# Patient Record
Sex: Female | Born: 1965 | Race: White | Hispanic: No | Smoking: Former smoker
Health system: Southern US, Community
[De-identification: ages and names within clinical notes are randomized; demographics above are authoritative.]

## PROBLEM LIST (undated history)

## (undated) DIAGNOSIS — F32A Depression, unspecified: Secondary | ICD-10-CM

## (undated) DIAGNOSIS — F329 Major depressive disorder, single episode, unspecified: Secondary | ICD-10-CM

## (undated) DIAGNOSIS — T7840XA Allergy, unspecified, initial encounter: Secondary | ICD-10-CM

## (undated) HISTORY — DX: Allergy, unspecified, initial encounter: T78.40XA

## (undated) HISTORY — DX: Depression, unspecified: F32.A

## (undated) HISTORY — PX: ENDOMETRIAL ABLATION: SHX621

---
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
## 1998-02-18 ENCOUNTER — Other Ambulatory Visit: Admission: RE | Admit: 1998-02-18 | Discharge: 1998-02-18 | Payer: Self-pay | Admitting: *Deleted

## 1999-01-25 ENCOUNTER — Inpatient Hospital Stay (HOSPITAL_COMMUNITY): Admission: AD | Admit: 1999-01-25 | Discharge: 1999-01-25 | Payer: Self-pay | Admitting: Obstetrics & Gynecology

## 2002-10-18 HISTORY — PX: BACK SURGERY: SHX140

## 2003-06-21 ENCOUNTER — Other Ambulatory Visit: Admission: RE | Admit: 2003-06-21 | Discharge: 2003-06-21 | Payer: Self-pay | Admitting: Obstetrics and Gynecology

## 2004-01-22 ENCOUNTER — Encounter (INDEPENDENT_AMBULATORY_CARE_PROVIDER_SITE_OTHER): Payer: Self-pay | Admitting: Specialist

## 2004-01-22 ENCOUNTER — Observation Stay (HOSPITAL_COMMUNITY): Admission: RE | Admit: 2004-01-22 | Discharge: 2004-01-23 | Payer: Self-pay | Admitting: Specialist

## 2004-10-07 IMAGING — CR DG SPINE 1V PORT
1 series · 1 of 1 positions shown · non-contrast
Comparison: none

CLINICAL DATA: 37 year-old lumbar laminectomy
 LATERAL LUMBAR SPINE FILM
 Lateral lumbar spine film from the Operating Room labeled #1 demonstrates a spinal needle projecting at L5-S1 disc space level.
 IMPRESSION
 L5-S1 marked intraoperatively.
 LUMBAR SPINE FILM # 2
 Surgical instruments and retractors at the L5-S1 level.

[view not recorded]
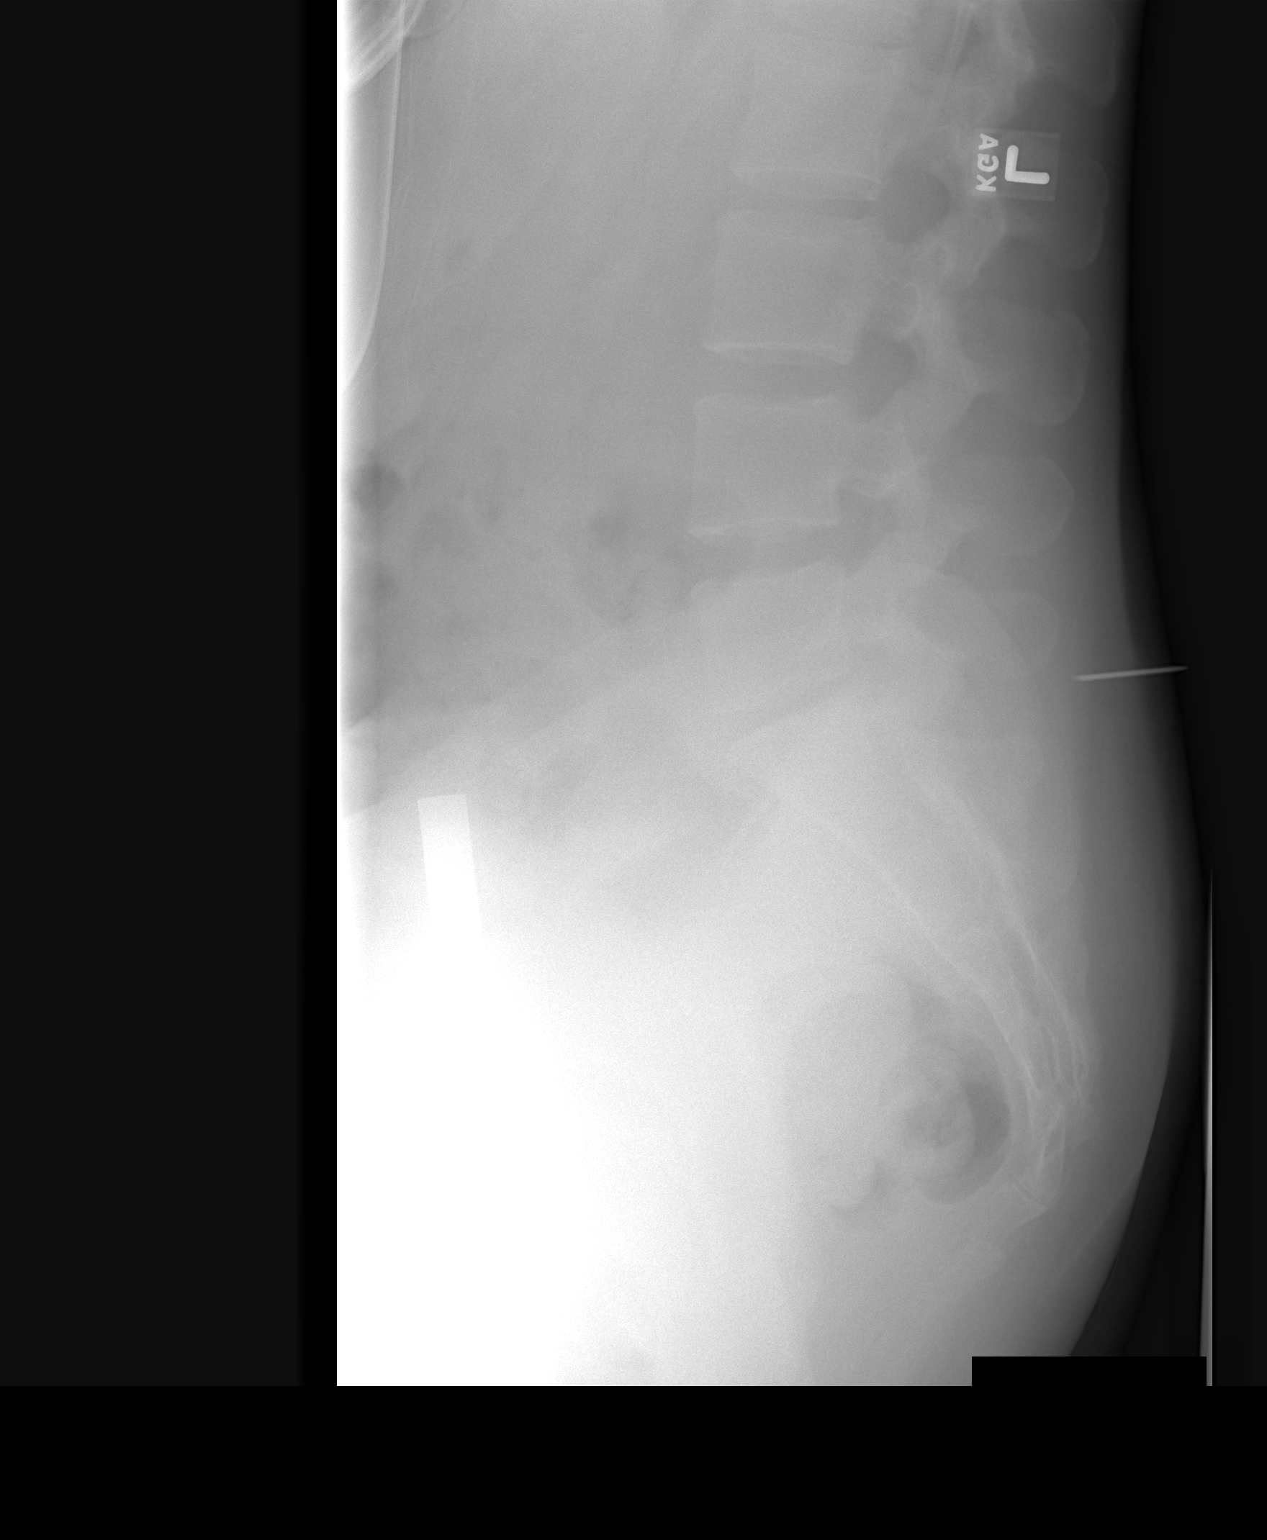

[1 of 1 positions shown; findings below may reference images not displayed]

## 2007-12-08 ENCOUNTER — Encounter (INDEPENDENT_AMBULATORY_CARE_PROVIDER_SITE_OTHER): Payer: Self-pay | Admitting: Obstetrics and Gynecology

## 2007-12-08 ENCOUNTER — Ambulatory Visit (HOSPITAL_COMMUNITY): Admission: RE | Admit: 2007-12-08 | Discharge: 2007-12-08 | Payer: Self-pay | Admitting: Obstetrics and Gynecology

## 2008-02-15 ENCOUNTER — Ambulatory Visit (HOSPITAL_COMMUNITY): Admission: RE | Admit: 2008-02-15 | Discharge: 2008-02-15 | Payer: Self-pay | Admitting: Obstetrics and Gynecology

## 2011-03-02 NOTE — H&P (Signed)
Alejandra Ryan, Alejandra Ryan               ACCOUNT NO.:  [id number]   MEDICAL RECORD NO.:  [id number]          PATIENT TYPE:  AMB   LOCATION:  SDC                           FACILITY:  [facility]   PHYSICIAN:  Lenoard Aden, M.D.DATE OF BIRTH:  20-Apr-1966   DATE OF ADMISSION:  12/08/2007  DATE OF DISCHARGE:                              HISTORY & PHYSICAL   CHIEF COMPLAINT:  Dysmenorrhea, menorrhagia, and dyspareunia for  surgical intervention.   HISTORY OF PRESENT ILLNESS:  She is a 45 year old white female, gravida  1, para 1, with a history of persistent dyspareunia and known labial  hypertrophy for NovaSure hysteroscopy, D&C, and perineorrhaphy, possible  labioplasty.   ALLERGIES:  PENICILLIN, SULFA.   FAMILY HISTORY:  Heart disease.   PAST MEDICAL HISTORY:  She has a history of a laser cone biopsy for CIN-  2 in 1989, history of cesarean section, history of intermittent tobacco  use.   PHYSICAL EXAMINATION:  GENERAL:  She is a well-developed, well-  nourished, white female in no acute distress.  HEENT:  Normal.  LUNGS:  Clear.  HEART:  Regular rhythm.  ABDOMEN:  Soft and nontender.  PELVIC:  The uterus is normal size, shape, and contour.  No adnexal  masses.  The uterus is anteverted.  EXTREMITIES:  No cords.  NEUROLOGY:  Nonfocal.  SKIN:  Intact.   IMPRESSION:  1. Dysmenorrhea and menometrorrhagia with endometrial polyp.  2. Labial hypertrophy, secondary dyspareunia for perineoplasty and      labial repair.   PLAN:  Proceed with diagnostic hysteroscopy, resectoscopic polypectomy,  D&C, NovaSure ablation, and perineorrhaphy labioplasty.  Risks of  anesthesia, infection, bleeding, injury to abdominal organs with need  for repair is discussed with possible uterine perforation and need for  repair is discussed, possible labial scarring with secondary dyspareunia  noted.  The patient acknowledges and wishes to proceed.     Lenoard Aden, M.D.  Electronically Signed    RJT/MEDQ  D:  12/07/2007  T:  12/07/2007  Job:  [id number]

## 2011-03-02 NOTE — Op Note (Signed)
NAMEJASELLE, Alejandra Ryan               ACCOUNT NO.:  [id number]   MEDICAL RECORD NO.:  [id number]          PATIENT TYPE:  AMB   LOCATION:  SDC                           FACILITY:  [facility]   PHYSICIAN:  Lenoard Aden, M.D.DATE OF BIRTH:  Dec 17, 1965   DATE OF PROCEDURE:  02/15/2008  DATE OF DISCHARGE:                               OPERATIVE REPORT   PREOPERATIVE DIAGNOSIS:  Dehiscence of previous perineorrhaphy.   POSTOPERATIVE DIAGNOSIS:  Dehiscence of previous perineorrhaphy.   PROCEDURE:  Repair of perineorrhaphy dehiscence.   SURGEON:  Lenoard Aden, MD   ANESTHESIA:  General.   ESTIMATED BLOOD LOSS:  50 mL.   COMPLICATIONS:  None.   DRAINS:  None.   COUNTS:  Correct.   DISPOSITION:  To recovery in good condition.   PROCEDURE IN DETAIL:  After apprised of risks of anesthesia, infection,  bleeding, injury to abdominal organs, need for repair, delayed versus  immediate complications to include bowel and bladder injury, and the  inability to assure perineal discomfort, the patient was brought to the  operating where she was administered a general anesthetic without  complications.  She was prepped and draped in usual sterile fashion and  catheterized until the bladder was empty.  After achieving adequate  anesthesia, the 2 dehiscent flaps were noted.  These areas were outlined  in relation to the area of previous attachment.  The surgical edge from  previous attachment was reconstructed using Metzenbaum scissors creating  a free surgical edge on both ends of the dehisced perineal repair.  These were then sutured on the inside and outside margins side-to-side  using a 3-0 Vicryl suture in an interrupted fashion.  Good  hemostasis was noted.  At this time, there was also a nodular area along  the left peroneal/posterior fourchette area which was excised sharply  and sutured using a 3-0 Vicryl suture.  Good hemostasis was noted.  Ice  pads were applied.  The patient  tolerated the procedure well and was  transferred to recovery in good condition.      Lenoard Aden, M.D.  Electronically Signed     RJT/MEDQ  D:  02/15/2008  T:  02/16/2008  Job:  [id number]   cc:   Rocco Pauls

## 2011-03-02 NOTE — Op Note (Signed)
[name], Alejandra Ryan               ACCOUNT NO.:  [id number]   MEDICAL RECORD NO.:  [id number]          PATIENT TYPE:  AMB   LOCATION:  SDC                           [facility]ACILITY:  [facility]   PHYSICIAN:  Lenoard Aden, M.D.DATE OF BIRTH:  08-26-1966   DATE OF PROCEDURE:  12/08/2007  DATE OF DISCHARGE:                               OPERATIVE REPORT   PREOPERATIVE DIAGNOSIS:  1. Vulvodynia.  2. Vulvar scarring.  3. Dyspareunia.  4. Labial hypertrophy.  5. Menorrhagia.  6. Endometrial polyp.   POSTOPERATIVE DIAGNOSIS:  1. Vulvodynia.  2. Vulvar scarring.  3. Dyspareunia.  4. Labial hypertrophy.  5. Menorrhagia.  6. Endometrial polyp.   PROCEDURE:  Diagnostic hysteroscopy, dilatation and curettage,  resectoscopic polypectomy, NovaSure endometrial ablation, labioplasty  perineorrhaphy.   SURGEON:  Lenoard Aden, M.D.   ANESTHESIA:  General.   ESTIMATED BLOOD LOSS:  50 mL.   COMPLICATIONS:  None.   DRAINS:  None.   COUNTS:  Correct.   DISPOSITION:  Patient to recovery in good condition.   SPECIMEN:  Endometrial curettings and endometrial polyp to pathology.   BRIEF OPERATIVE NOTE:  After being apprised of the risks of anesthesia,  infection, bleeding, injury to abdominal organs with need repair,  delayed versus immediate complications to include bowel and bladder  injury, the patient was brought to the operating room where she was  administered a general anesthetic without complications, prepped and  draped in the usual sterile fashion, catheterized until the bladder was  empty.  After achieving adequate anesthesia, a speculum was placed.  The  anterior lip of the cervix was grasped using a single tooth tenaculum  and a dilute paracervical block was placed using 20 mL Nesacaine  solution.  A dilute solution of Pitressin was placed at 3 and 9 o'clock  for an intracervical block after aspiration to make sure no blood was in  this syringe, a total of 16 mL.  The cervix  was then easily dilated up  to #29 [facility] dilator.  The hysteroscope was placed.  Visualization  revealed a broad based endometrial polyp along the anterior wall which  was resected using a single loop with two passes. At this time, no  lesions were seen. D&C is performed without difficulty using sharp  curettage in a four quadrant method.  At this time, measurements having  been taken for the NovaSure, the NovaSure device was placed and CO2 test  was performed and is negative.  NovaSure is then performed without  difficulty after activating the NovaSure, the device measures a cavity  width of 2.9, power of 62 time, time of 1 minute 25 seconds.  The  NovaSure is removed.  The hysteroscope is replaced. Visualization  reveals no evidence of uterine perforation and a well ablated  endometrial cavity.   At this time, attention is turned to the exterior portion where there is  marked labial hypertrophy and scarring noted.  A V-shaped incision was  made along the labia minora on the right and left side excising a large  wedge shaped piece of tissue.  This was closed inside  to out creating an  anterior suture line using multiple interrupted 3-0 Vicryl sutures. At  this time, a perineorrhaphy was placed in standard fashion and closed  using a 3-0 Vicryl suture, as well.  Attention was turned to some  scarring along both labia minora anteriorly.  A  small flap of tissue  was then removed from both sides and the defect was closed using  interrupted 3-0 Vicryl sutures.  Good hemostasis was noted.  All  instruments were removed.  The patient tolerated the procedure well and  was awakened and transferred to recovery in good condition.      Lenoard Aden, M.D.  Electronically Signed     RJT/MEDQ  D:  12/08/2007  T:  12/09/2007  Job:  [id number]

## 2011-03-05 NOTE — Op Note (Signed)
NAME:  Alejandra Ryan, Alejandra Ryan                           ACCOUNT NO.:  [id number]   MEDICAL RECORD NO.:  [id number]                   PATIENT TYPE:  AMB   LOCATION:  DAY                                  FACILITY:  [facility]   PHYSICIAN:  Jene Every, M.D.                 DATE OF BIRTH:  April 04, 1966   DATE OF PROCEDURE:  01/22/2004  DATE OF DISCHARGE:                                 OPERATIVE REPORT   PREOPERATIVE DIAGNOSES:  1. Herniated nucleus pulposus, L5-S1.  2. Degenerative disk disease, L5-S1.  3. Free fragment.  4. Spinal stenosis.   POSTOPERATIVE DIAGNOSES:  1. Herniated nucleus pulposus, L5-S1.  2. Degenerative disk disease, L5-S1.  3. Free fragment.  4. Spinal stenosis.   OPERATION/PROCEDURE:  1. Lateral recess decompression, L5-S1.  2. Treatment of free fragment.  3. Decompression of L5 nerve root.  4. Microdiskectomy, L5-S1.   ANESTHESIA:  General.   SURGEON:  Jene Every, M.D.   ASSISTANT:  Roma Schanz, P.A.-C.   BRIEF HISTORY AND INDICATIONS:  This is a 45 year old female with acute  onset of L5 radiculopathy demonstrated by MRI to compress the L5 root.  She  had a history of disk degeneration, posterior osteophytic spurring the  fragment was high on the L5 vertebral body midway between the L4-5 and L5-S1  disk space, probably a migrated cephalad from L5-S1 disk space compressing  the 5 root.  Due to the compression and the positive neurotension signs, a  preoperative exam demonstrate decreased sensation in the L5-S1 dermatome and  the HL weakness.  Operative intervention indicated for decompression of the  L5 root.  We discussed the possibility of requiring the decompression at the  L4-5 and at L5-S1 due to the cephalomigration and the probability of  requiring a hemilaminectomy.  Also, risks and benefits were discussed  including bleeding, infection, injury to vascular structures, recurrent disk  herniation, need for fusion, adjacent segment disease, epidural  fibrosis,  persistent numbness and weakness, etc.   DESCRIPTION OF PROCEDURE:  With the patient in the supine position and after  induction of adequate general anesthesia and 1 g of Kefzol, she  was placed  prone on the [place] frame.  All bony prominences were well padded.  The  lumbar region was prepped and draped in the usual sterile fashion.  An 13-  gauge spinal needle was utilized to localize the L5-S1 interspace confirmed  with x-ray.  Incision was made from above the spinous process of L5-S1.  Subcutaneous tissue was dissected using electrocautery to achieve  hemostasis.  Dorsal lumbar fascia was identified and divided in  line with  the skin incision.  Paraspinous muscle elevated from the lamina of L5-S1 so  that we could visualize the L4-5 interlaminar space.  Second confirmatory  radiograph was obtained showing the [facility] retractor and then the  pedicle and probable placement of the disk rupture was cephalad to form  the  hemilaminotomy of the caudad edge of the L5 with the two 3-mm Kerrisons,  preserving the pars extending cephalad; this to the mid portion of an upper  border of the pedicle as evidenced by the MRI.  This was attached to the  cephalad edge of the ligamentum flavum was carefully, meticulously done,  preserving the L5 root.  Ligamentum flavum had been detached and cephalad  edge of S1 and I decompressed the lateral recess by performing partial  medial hemifacetectomy, lateral to the thecal sac and nerve root.  Ligamentum flavum removed from the interspace.  There was epidural venous  plexus.  The disk rupture at L5-S1 was noted but cephalad and the axilla of  the root severely compressing the root to the lateral margin as well as a  disk fragment.  This was mobilized without traction utilizing a  nerve hook  mobilized, three separate large fragments from beneath the thecal sac and  into the undersurface of the L5 root.  We explored above the L5 root and  above  the L5 pedicles so that we were clearly above and below where the  fragments were noted.  Following this, there was excellent mobilization of  the L5 root.  No residual disk herniation.  Bipolar electrocautery to  achieve hemostasis.   Next, we gently mobilized the thecal sac medially and there was a prominent  central rupture at L5-S1.  I performed a hemilaminotomy and the posterior  portion of the disk material was removed from the disk space.  On retracting  the neural element, it was felt at this time that there was a posterior  osteophytic spur at L5. We performed a foraminotomy of S1.  The __________  was then placed in the foramina of L5-S1 and found to be widely patent.  The  L5 root was freely mobile with excursion.  It was erythematous, however, not  edematous.  Disk space was copiously irrigated with antibiotic irrigation.  Inspection revealed no CSF leakage or active bleeding.  Thrombin-soaked  Gelfoam was placed in the laminotomy defect.  We removed the [facility]  retractor.  Paraspinous muscles inspected and there was no active bleeding.   Dorsal lumbar fascia was reapproximated with #1 Vicryl interrupted figure-of-  eight sutures, subcutaneous tissue with 2-0 Vicryl interrupted sutures.  Skin reapproximated with 4-0 subcuticular Prolene.  Steri-Strips, sterile  dressing applied.  She was placed supine on the hospital bed, extubated  without difficulty and transported to the recovery room in stable condition.  The patient tolerated the procedure well.  There were no complications.  Minimal blood loss.   ADDENDUM:  We checked in the axilla of the S1 root beneath the thecal sac  without residual disk herniation noted.  Also gave her 8 mg IV Decadron.                                               Jene Every, M.D.    Cordelia Pen  D:  01/22/2004  T:  01/22/2004  Job:  [id number]

## 2011-03-05 NOTE — H&P (Signed)
NAME:  Alejandra Ryan, Alejandra Ryan                           ACCOUNT NO.:  [id number]   MEDICAL RECORD NO.:  [id number]                   PATIENT TYPE:  AMB   LOCATION:  DAY                                  [facility]ACILITY[facility]   PHYSICIAN:  Jene Every, M.D.                 DATE OF BIRTH:  May 16, 1966   DATE OF ADMISSION:  01/22/2004  DATE OF DISCHARGE:                                HISTORY & PHYSICAL   CHIEF COMPLAINT:  Right lower extremity pain.   HISTORY:  Ms. Alejandra Ryan had acute onset of low back pain approximately four weeks  ago. She states she then took a fall down the stairs and noted onset of  severe right lower extremity pain with progression to numbness and tingling  into the leg. An immediate MRI was ordered in our office which show a  moderate right paracentral disk herniation at L5-S1 level compressing the  right anterior aspect of the thecal sac. Also of note is an extruded disk  fragment in the right paracentral region behind the body of L5 compression  in the right thecal sac and right L5-S1 nerve root. Surgery was scheduled  immediately. The risks and benefits were discussed with the patient and she  wishes to proceed.   MEDICAL HISTORY:  Noncontributory.   CURRENT MEDICATIONS:  Vicodin p.r.n., Robaxin, Dosepak.   ALLERGIES:  PENICILLIN and SULFA which cause rash.   PREVIOUS SURGERY:  Cesarean section in 1998.   SOCIAL HISTORY:  The patient is divorced. She denies any tobacco intake.  Occasional alcohol consumption.  Her parents will be caregivers following  surgery.   FAMILY HISTORY:  Grandfather with history significant for coronary artery  disease.  Grandmother with CVA.   REVIEW OF SYSTEMS:  General: The patient denies any fever, chills,  nightsweats, or bleeding tendencies. CNS: No blurred, double vision,  seizure, headache, or paralysis. Respiratory: No shortness of breath,  productive cough, or hemoptysis. Cardiovascular: No chest pain, angina, or  orthopnea. GU: No  dysuria, hematuria, or discharge. GI: Mild constipation.  No nausea, vomiting, diarrhea, or bloody stools. Musculoskeletal: Pertinent  to HPI.   PHYSICAL EXAMINATION:  Vital signs: Pulse 76, respiratory 16, BP 126/84.  General: This is a well-developed, well-nourished young lady in mild  distress. HEENT:  Normocephalic and atraumatic. Pupils equal, round, and  reactive to light. EOMs intact. Neck: Supple with no lymphadenopathy. Chest:  Clear to auscultation bilaterally. No rales, rhonchi, or wheezes. Heart:  Regular rate and rhythm without murmurs, rubs, or gallops. Abdomen: Soft,  nontender, and nondistended. Bowel sounds times four. Breasts/GU: Not  examined. Not pertinent to HPI. Skin: No rashes or lesions noted.  Extremities: The patient does have positive straight leg raise on the right  with posterior thigh and buttocks pain. She does have EHL weakness on the  right as compared to the left.   IMPRESSION:  Large herniated nucleosis pulposus at L4-5  with free fragment.   PLAN:  Microdiskectomy at the L4-5 level.     Roma Schanz, P.A.                   Jene Every, M.D.    CS/MEDQ  D:  01/21/2004  T:  01/21/2004  Job:  [id number]

## 2011-03-10 ENCOUNTER — Other Ambulatory Visit: Payer: Self-pay | Admitting: Obstetrics and Gynecology

## 2011-07-12 LAB — CBC
Platelets: 260
RBC: 4.15

## 2011-07-12 LAB — HCG, SERUM, QUALITATIVE: Preg, Serum: NEGATIVE

## 2011-07-13 LAB — CBC
Hemoglobin: 13.4
MCHC: 35.3
Platelets: 195
RBC: 4.23
RDW: 12.5
WBC: 5.4

## 2011-07-13 LAB — HCG, SERUM, QUALITATIVE: Preg, Serum: NEGATIVE

## 2011-07-15 ENCOUNTER — Other Ambulatory Visit: Payer: Self-pay | Admitting: Obstetrics and Gynecology

## 2011-07-15 DIAGNOSIS — Z1231 Encounter for screening mammogram for malignant neoplasm of breast: Secondary | ICD-10-CM

## 2011-07-29 ENCOUNTER — Ambulatory Visit: Payer: Self-pay

## 2011-08-18 ENCOUNTER — Ambulatory Visit
Admission: RE | Admit: 2011-08-18 | Discharge: 2011-08-18 | Disposition: A | Discharge status: Home / Self Care | Payer: BC Managed Care – PPO | Source: Ambulatory Visit | Attending: Obstetrics and Gynecology | Admitting: Obstetrics and Gynecology

## 2011-08-18 DIAGNOSIS — Z1231 Encounter for screening mammogram for malignant neoplasm of breast: Secondary | ICD-10-CM

## 2012-09-06 ENCOUNTER — Other Ambulatory Visit: Payer: Self-pay | Admitting: Obstetrics and Gynecology

## 2012-09-06 DIAGNOSIS — Z1231 Encounter for screening mammogram for malignant neoplasm of breast: Secondary | ICD-10-CM

## 2012-10-12 ENCOUNTER — Ambulatory Visit
Admission: RE | Admit: 2012-10-12 | Discharge: 2012-10-12 | Disposition: A | Discharge status: Home / Self Care | Payer: BC Managed Care – PPO | Source: Ambulatory Visit | Attending: Obstetrics and Gynecology | Admitting: Obstetrics and Gynecology

## 2012-10-12 DIAGNOSIS — Z1231 Encounter for screening mammogram for malignant neoplasm of breast: Secondary | ICD-10-CM

## 2014-05-10 ENCOUNTER — Other Ambulatory Visit: Payer: Self-pay

## 2014-05-10 DIAGNOSIS — Z1231 Encounter for screening mammogram for malignant neoplasm of breast: Secondary | ICD-10-CM

## 2014-05-15 ENCOUNTER — Ambulatory Visit: Payer: BC Managed Care – PPO

## 2014-05-16 ENCOUNTER — Ambulatory Visit
Admission: RE | Admit: 2014-05-16 | Discharge: 2014-05-16 | Disposition: A | Discharge status: Home / Self Care | Payer: BC Managed Care – PPO | Source: Ambulatory Visit

## 2014-05-16 DIAGNOSIS — Z1231 Encounter for screening mammogram for malignant neoplasm of breast: Secondary | ICD-10-CM

## 2016-12-20 DIAGNOSIS — Z1231 Encounter for screening mammogram for malignant neoplasm of breast: Secondary | ICD-10-CM | POA: Diagnosis not present

## 2017-02-21 DIAGNOSIS — Z6823 Body mass index (BMI) 23.0-23.9, adult: Secondary | ICD-10-CM | POA: Diagnosis not present

## 2017-02-21 DIAGNOSIS — Z1321 Encounter for screening for nutritional disorder: Secondary | ICD-10-CM | POA: Diagnosis not present

## 2017-02-21 DIAGNOSIS — Z130 Encounter for screening for diseases of the blood and blood-forming organs and certain disorders involving the immune mechanism: Secondary | ICD-10-CM | POA: Diagnosis not present

## 2017-02-21 DIAGNOSIS — Z13220 Encounter for screening for lipoid disorders: Secondary | ICD-10-CM | POA: Diagnosis not present

## 2017-02-21 DIAGNOSIS — Z01419 Encounter for gynecological examination (general) (routine) without abnormal findings: Secondary | ICD-10-CM | POA: Diagnosis not present

## 2017-02-21 DIAGNOSIS — Z0000 Encounter for general adult medical examination without abnormal findings: Secondary | ICD-10-CM | POA: Diagnosis not present

## 2017-12-15 ENCOUNTER — Encounter: Payer: Self-pay | Admitting: Gastroenterology

## 2017-12-15 DIAGNOSIS — R82998 Other abnormal findings in urine: Secondary | ICD-10-CM | POA: Diagnosis not present

## 2017-12-15 DIAGNOSIS — Z1389 Encounter for screening for other disorder: Secondary | ICD-10-CM | POA: Diagnosis not present

## 2017-12-15 DIAGNOSIS — M7711 Lateral epicondylitis, right elbow: Secondary | ICD-10-CM | POA: Diagnosis not present

## 2017-12-15 DIAGNOSIS — Z0000 Encounter for general adult medical examination without abnormal findings: Secondary | ICD-10-CM | POA: Diagnosis not present

## 2017-12-26 DIAGNOSIS — Z1231 Encounter for screening mammogram for malignant neoplasm of breast: Secondary | ICD-10-CM | POA: Diagnosis not present

## 2018-02-14 DIAGNOSIS — R509 Fever, unspecified: Secondary | ICD-10-CM | POA: Diagnosis not present

## 2018-02-17 ENCOUNTER — Encounter: Payer: Self-pay | Admitting: Gastroenterology

## 2018-02-21 DIAGNOSIS — Z01419 Encounter for gynecological examination (general) (routine) without abnormal findings: Secondary | ICD-10-CM | POA: Diagnosis not present

## 2018-02-21 DIAGNOSIS — Z6823 Body mass index (BMI) 23.0-23.9, adult: Secondary | ICD-10-CM | POA: Diagnosis not present

## 2018-02-22 DIAGNOSIS — H6690 Otitis media, unspecified, unspecified ear: Secondary | ICD-10-CM | POA: Diagnosis not present

## 2018-02-22 DIAGNOSIS — H6983 Other specified disorders of Eustachian tube, bilateral: Secondary | ICD-10-CM | POA: Diagnosis not present

## 2018-03-17 ENCOUNTER — Encounter: Payer: Self-pay | Admitting: Gastroenterology

## 2018-07-28 DIAGNOSIS — R82998 Other abnormal findings in urine: Secondary | ICD-10-CM | POA: Diagnosis not present

## 2018-07-28 DIAGNOSIS — Z0000 Encounter for general adult medical examination without abnormal findings: Secondary | ICD-10-CM | POA: Diagnosis not present

## 2018-08-03 DIAGNOSIS — Z1389 Encounter for screening for other disorder: Secondary | ICD-10-CM | POA: Diagnosis not present

## 2018-08-03 DIAGNOSIS — Z0000 Encounter for general adult medical examination without abnormal findings: Secondary | ICD-10-CM | POA: Diagnosis not present

## 2018-08-03 DIAGNOSIS — Z23 Encounter for immunization: Secondary | ICD-10-CM | POA: Diagnosis not present

## 2018-10-17 DIAGNOSIS — R21 Rash and other nonspecific skin eruption: Secondary | ICD-10-CM | POA: Diagnosis not present

## 2018-10-17 DIAGNOSIS — R509 Fever, unspecified: Secondary | ICD-10-CM | POA: Diagnosis not present

## 2018-10-17 DIAGNOSIS — B349 Viral infection, unspecified: Secondary | ICD-10-CM | POA: Diagnosis not present

## 2019-03-08 DIAGNOSIS — Z6823 Body mass index (BMI) 23.0-23.9, adult: Secondary | ICD-10-CM | POA: Diagnosis not present

## 2019-03-08 DIAGNOSIS — Z01419 Encounter for gynecological examination (general) (routine) without abnormal findings: Secondary | ICD-10-CM | POA: Diagnosis not present

## 2019-03-08 DIAGNOSIS — Z1231 Encounter for screening mammogram for malignant neoplasm of breast: Secondary | ICD-10-CM | POA: Diagnosis not present

## 2019-08-03 DIAGNOSIS — Z0000 Encounter for general adult medical examination without abnormal findings: Secondary | ICD-10-CM | POA: Diagnosis not present

## 2019-08-03 DIAGNOSIS — R7989 Other specified abnormal findings of blood chemistry: Secondary | ICD-10-CM | POA: Diagnosis not present

## 2019-08-10 DIAGNOSIS — Z0000 Encounter for general adult medical examination without abnormal findings: Secondary | ICD-10-CM | POA: Diagnosis not present

## 2019-08-10 DIAGNOSIS — Z1331 Encounter for screening for depression: Secondary | ICD-10-CM | POA: Diagnosis not present

## 2019-08-10 DIAGNOSIS — R82998 Other abnormal findings in urine: Secondary | ICD-10-CM | POA: Diagnosis not present

## 2019-08-16 ENCOUNTER — Encounter: Payer: Self-pay | Admitting: Gastroenterology

## 2019-09-12 ENCOUNTER — Encounter: Payer: Self-pay | Admitting: Gastroenterology

## 2019-09-12 ENCOUNTER — Other Ambulatory Visit: Payer: Self-pay

## 2019-09-12 ENCOUNTER — Ambulatory Visit (AMBULATORY_SURGERY_CENTER): Payer: BC Managed Care – PPO | Admitting: *Deleted

## 2019-09-12 VITALS — Temp 97.5°F | Ht 65.0 in | Wt 141.0 lb

## 2019-09-12 DIAGNOSIS — Z1159 Encounter for screening for other viral diseases: Secondary | ICD-10-CM

## 2019-09-12 DIAGNOSIS — Z1211 Encounter for screening for malignant neoplasm of colon: Secondary | ICD-10-CM

## 2019-09-12 MED ORDER — NA SULFATE-K SULFATE-MG SULF 17.5-3.13-1.6 GM/177ML PO SOLN: ORAL | 0 refills | Status: DC

## 2019-09-12 NOTE — Progress Notes (Signed)
Patient is here in-person for PV. Patient denies any allergies to eggs or soy. Patient denies any problems with anesthesia/sedation. Patient denies any oxygen use at home. Patient denies taking any diet/weight loss medications or blood thinners. Patient is not being treated for MRSA or C-diff. EMMI education assisgned to the patient for the procedure, this was explained and instructions given to patient. COVID-19 screening test is on 12/8 1045 am, the pt is aware. Pt is aware that care partner will wait in the car during procedure; if they feel like they will be too hot or cold to wait in the car; they may wait in the 4 th floor lobby. Patient is aware to bring only one care partner. We want them to wear a mask (we do not have any that we can provide them), practice social distancing, and we will check their temperatures when they get here.  I did remind the patient that their care partner needs to stay in the parking lot the entire time and have a cell phone available, we will call them when the pt is ready for discharge. Patient will wear mask into building.    Suprep $15 off coupon given to the patient.

## 2019-09-25 ENCOUNTER — Ambulatory Visit (INDEPENDENT_AMBULATORY_CARE_PROVIDER_SITE_OTHER): Payer: BC Managed Care – PPO

## 2019-09-25 ENCOUNTER — Other Ambulatory Visit: Payer: Self-pay | Admitting: Gastroenterology

## 2019-09-25 DIAGNOSIS — Z1159 Encounter for screening for other viral diseases: Secondary | ICD-10-CM

## 2019-09-26 LAB — SARS CORONAVIRUS 2 (TAT 6-24 HRS): SARS Coronavirus 2: NEGATIVE

## 2019-09-28 ENCOUNTER — Other Ambulatory Visit: Payer: Self-pay

## 2019-09-28 ENCOUNTER — Ambulatory Visit (AMBULATORY_SURGERY_CENTER): Payer: BC Managed Care – PPO | Admitting: Gastroenterology

## 2019-09-28 ENCOUNTER — Encounter: Payer: Self-pay | Admitting: Gastroenterology

## 2019-09-28 VITALS — BP 124/84 | HR 63 | Temp 98.4°F | Resp 16 | Ht 65.0 in | Wt 141.0 lb

## 2019-09-28 DIAGNOSIS — Z1211 Encounter for screening for malignant neoplasm of colon: Secondary | ICD-10-CM

## 2019-09-28 MED ORDER — SODIUM CHLORIDE 0.9 % IV SOLN: 500.0000 mL | Freq: Once | INTRAVENOUS | Status: DC

## 2019-09-28 NOTE — Progress Notes (Signed)
VS-KA Temp-LC  Pt's states no medical or surgical changes since previsit or office visit.  

## 2019-09-28 NOTE — Op Note (Signed)
[facility] Patient Name: Alejandra Ryan Procedure Date: 09/28/2019 8:58 AM MRN: [id number] Endoscopist: Meryl DareMalcolm T [place] , MD Age: 53 Referring MD:  Date of Birth: 12/05/1965 Gender: Female Account #: [id number] Procedure:                Colonoscopy Indications:              Screening for colorectal malignant neoplasm Medicines:                Monitored Anesthesia Care Procedure:                Pre-Anesthesia Assessment:                           - Prior to the procedure, a History and Physical                            was performed, and patient medications and                            allergies were reviewed. The patient's tolerance of                            previous anesthesia was also reviewed. The risks                            and benefits of the procedure and the sedation                            options and risks were discussed with the patient.                            All questions were answered, and informed consent                            was obtained. Prior Anticoagulants: The patient has                            taken no previous anticoagulant or antiplatelet                            agents. ASA Grade Assessment: II - A patient with                            mild systemic disease. After reviewing the risks                            and benefits, the patient was deemed in                            satisfactory condition to undergo the procedure.                           After obtaining informed consent, the colonoscope  was passed under direct vision. Throughout the                            procedure, the patient's blood pressure, pulse, and                            oxygen saturations were monitored continuously. The                            Colonoscope was introduced through the anus and                            advanced to the the cecum, identified by                            appendiceal orifice and  ileocecal valve. The                            ileocecal valve, appendiceal orifice, and rectum                            were photographed. The quality of the bowel                            preparation was good. The colonoscopy was performed                            without difficulty. The patient tolerated the                            procedure well. Scope In: 9:05:25 AM Scope Out: 9:16:41 AM Scope Withdrawal Time: 0 hours 9 minutes 42 seconds  Total Procedure Duration: 0 hours 11 minutes 16 seconds  Findings:                 Skin tags were found on perianal exam.                           Internal hemorrhoids were found during                            retroflexion. The hemorrhoids were small and Grade                            I (internal hemorrhoids that do not prolapse).                           The exam was otherwise without abnormality on                            direct and retroflexion views. Complications:            No immediate complications. Estimated blood loss:                            None.  Estimated Blood Loss:     Estimated blood loss: none. Impression:               - Perianal skin tags found on perianal exam.                           - Internal hemorrhoids.                           - The examination was otherwise normal on direct                            and retroflexion views.                           - No specimens collected. Recommendation:           - Repeat colonoscopy in 10 years for screening                            purposes.                           - Patient has a contact number available for                            emergencies. The signs and symptoms of potential                            delayed complications were discussed with the                            patient. Return to normal activities tomorrow.                            Written discharge instructions were provided to the                            patient.                            - Resume previous diet.                           - Continue present medications. Ladene Artist, MD 09/28/2019 9:19:20 AM This report has been signed electronically.

## 2019-09-28 NOTE — Patient Instructions (Signed)
YOU HAD AN ENDOSCOPIC PROCEDURE TODAY AT THE [facility]:   Refer to the procedure report that was given to you for any specific questions about what was found during the examination.  If the procedure report does not answer your questions, please call your gastroenterologist to clarify.  If you requested that your care partner not be given the details of your procedure findings, then the procedure report has been included in a sealed envelope for you to review at your convenience later.  YOU SHOULD EXPECT: Some feelings of bloating in the abdomen. Passage of more gas than usual.  Walking can help get rid of the air that was put into your GI tract during the procedure and reduce the bloating. If you had a lower endoscopy (such as a colonoscopy or flexible sigmoidoscopy) you may notice spotting of blood in your stool or on the toilet paper. If you underwent a bowel prep for your procedure, you may not have a normal bowel movement for a few days.  Please Note:  You might notice some irritation and congestion in your nose or some drainage.  This is from the oxygen used during your procedure.  There is no need for concern and it should clear up in a day or so.  SYMPTOMS TO REPORT IMMEDIATELY:   Following lower endoscopy (colonoscopy or flexible sigmoidoscopy):  Excessive amounts of blood in the stool  Significant tenderness or worsening of abdominal pains  Swelling of the abdomen that is new, acute  Fever of 100F or higher  For urgent or emergent issues, a gastroenterologist can be reached at any hour by calling [phone].   DIET:  We do recommend a small meal at first, but then you may proceed to your regular diet.  Drink plenty of fluids but you should avoid alcoholic beverages for 24 hours.  ACTIVITY:  You should plan to take it easy for the rest of today and you should NOT DRIVE or use heavy machinery until tomorrow (because of the sedation medicines used during the test).     FOLLOW UP: Our staff will call the number listed on your records 48-72 hours following your procedure to check on you and address any questions or concerns that you may have regarding the information given to you following your procedure. If we do not reach you, we will leave a message.  We will attempt to reach you two times.  During this call, we will ask if you have developed any symptoms of COVID 19. If you develop any symptoms (ie: fever, flu-like symptoms, shortness of breath, cough etc.) before then, please call [phone].  If you test positive for Covid 19 in the 2 weeks post procedure, please call and report this information to us.    If any biopsies were taken you will be contacted by phone or by letter within the next 1-3 weeks.  Please call us at [phone] if you have not heard about the biopsies in 3 weeks.    SIGNATURES/CONFIDENTIALITY: You and/or your care partner have signed paperwork which will be entered into your electronic medical record.  These signatures attest to the fact that that the information above on your After Visit Summary has been reviewed and is understood.  Full responsibility of the confidentiality of this discharge information lies with you and/or your care-partner. 

## 2019-09-28 NOTE — Progress Notes (Signed)
Report to PACU, RN, vss, BBS= Clear.  

## 2019-10-02 ENCOUNTER — Telehealth: Payer: Self-pay

## 2019-10-02 NOTE — Telephone Encounter (Signed)
  Follow up Call-  Call back number 09/28/2019  Post procedure Call Back phone  # [phone]  Permission to leave phone message Yes  Some recent data might be hidden     Patient questions:  Do you have a fever, pain , or abdominal swelling? No. Pain Score  0 *  Have you tolerated food without any problems? Yes.    Have you been able to return to your normal activities? Yes.    Do you have any questions about your discharge instructions: Diet   No. Medications  No. Follow up visit  No.  Do you have questions or concerns about your Care? No.  Actions: * If pain score is 4 or above: No action needed, pain <4. 1. Have you developed a fever since your procedure? no  2.   Have you had an respiratory symptoms (SOB or cough) since your procedure? no  3.   Have you tested positive for COVID 19 since your procedure no  4.   Have you had any family members/close contacts diagnosed with the COVID 19 since your procedure?  no   If yes to any of these questions please route to Joylene John, RN and Alphonsa Gin, Therapist, sports.

## 2020-07-02 ENCOUNTER — Other Ambulatory Visit: Payer: Self-pay

## 2020-07-02 DIAGNOSIS — Z20822 Contact with and (suspected) exposure to covid-19: Secondary | ICD-10-CM

## 2020-07-05 LAB — NOVEL CORONAVIRUS, NAA: SARS-CoV-2, NAA: NOT DETECTED

## 2021-03-02 ENCOUNTER — Encounter: Payer: Self-pay | Admitting: Family

## 2021-03-02 ENCOUNTER — Ambulatory Visit (INDEPENDENT_AMBULATORY_CARE_PROVIDER_SITE_OTHER): Payer: Managed Care, Other (non HMO)

## 2021-03-02 ENCOUNTER — Ambulatory Visit (INDEPENDENT_AMBULATORY_CARE_PROVIDER_SITE_OTHER): Payer: Managed Care, Other (non HMO) | Admitting: Family

## 2021-03-02 VITALS — Ht 65.0 in | Wt 141.0 lb

## 2021-03-02 DIAGNOSIS — M545 Low back pain, unspecified: Secondary | ICD-10-CM

## 2021-03-02 MED ORDER — PREDNISONE 50 MG PO TABS: ORAL_TABLET | ORAL | 0 refills | Status: AC

## 2021-03-02 MED ORDER — HYDROCODONE-ACETAMINOPHEN 5-325 MG PO TABS: 1.0000 | ORAL_TABLET | Freq: Four times a day (QID) | ORAL | 0 refills | Status: AC | PRN

## 2021-03-02 MED ORDER — METHOCARBAMOL 500 MG PO TABS: 500.0000 mg | ORAL_TABLET | Freq: Three times a day (TID) | ORAL | 0 refills | Status: DC

## 2021-03-02 MED ORDER — METHOCARBAMOL 500 MG PO TABS: 500.0000 mg | ORAL_TABLET | Freq: Three times a day (TID) | ORAL | 0 refills | Status: AC

## 2021-03-02 NOTE — Progress Notes (Signed)
Office Visit Note   Patient: Alejandra Ryan           Date of Birth: 1966-10-03           MRN: [id number] Visit Date: 03/02/2021              Requested by: Gaspar Garbe, MD [address] [place],  [place] [address] PCP: Wylene Simmer Adelfa Koh, MD  Chief Complaint  Patient presents with  . Lower Back - Pain      HPI: The patient is a 55 year old woman who presents today complaining of a 2-day history of low back pain.  She states she was on her hands and knees on Saturday spreading mulch in her flower bed.  When she attempted to get to a standing position had immediate onset of low back pain.  She describes a stabbing and spasming of her low back.  She denies any radicular symptoms no numbness no tingling she does have relief when she lies down flat on her back standing or walking worsen her symptoms.  She does have a history of ruptured disc in 2004.  States this feels different than the pain she experienced at that time.  She has been taking meloxicam without relief  No red flag symptoms  Assessment & Plan: Visit Diagnoses:  1. Acute bilateral low back pain, unspecified whether sciatica present     Plan:   Follow-Up Instructions: No follow-ups on file.   Back Exam   Tenderness  The patient is experiencing tenderness in the sacroiliac.  Range of Motion  The patient has normal back ROM.  Muscle Strength  The patient has normal back strength.  Tests  Straight leg raise right: positive Straight leg raise left: positive      Patient is alert, oriented, no adenopathy, well-dressed, normal affect, normal respiratory effort. On exam is leaning forward in chair for comfort. No palpable spasm.  Imaging: No results found. No images are attached to the encounter.  Labs: No results found for: HGBA1C, ESRSEDRATE, CRP, LABURIC, REPTSTATUS, GRAMSTAIN, CULT, LABORGA   No results found for: ALBUMIN, PREALBUMIN, CBC  No results found for: MG No results found for:  VD25OH  No results found for: PREALBUMIN CBC EXTENDED 02/15/2008 12/08/2007  WBC 5.4 6.7  RBC 4.23 4.15  HGB 13.4 13.3  HCT 37.9 37.6  PLT 195 260     Body mass index is 23.46 kg/m.  Orders:  Orders Placed This Encounter  Procedures  . XR Lumbar Spine 2-3 Views   Meds ordered this encounter  Medications  . predniSONE (DELTASONE) 50 MG tablet    Sig: Take one tablet by mouth once daily for 5 days.    Dispense:  5 tablet    Refill:  0  . HYDROcodone-acetaminophen (NORCO/VICODIN) 5-325 MG tablet    Sig: Take 1 tablet by mouth every 6 (six) hours as needed for moderate pain.    Dispense:  20 tablet    Refill:  0  . DISCONTD: methocarbamol (ROBAXIN) 500 MG tablet    Sig: Take 1 tablet (500 mg total) by mouth 3 (three) times daily.    Dispense:  30 tablet    Refill:  0  . methocarbamol (ROBAXIN) 500 MG tablet    Sig: Take 1 tablet (500 mg total) by mouth 3 (three) times daily.    Dispense:  30 tablet    Refill:  0     Procedures: No procedures performed  Clinical Data: No additional findings.  ROS:  All other systems negative, except as noted in the HPI. Review of Systems  Constitutional: Negative for chills and fever.  Musculoskeletal: Positive for back pain and myalgias.  Neurological: Negative for weakness and numbness.    Objective: Vital Signs: Ht 5\' 5"  (1.651 m)   Wt 141 lb (64 kg)   BMI 23.46 kg/m   Specialty Comments:  No specialty comments available.  PMFS History: There are no problems to display for this patient.  Past Medical History:  Diagnosis Date  . Allergy   . Depression     Family History  Problem Relation Age of Onset  . Colon cancer Cousin 42  . Esophageal cancer Neg Hx   . Rectal cancer Neg Hx   . Stomach cancer Neg Hx   . Colon polyps Neg Hx     Past Surgical History:  Procedure Laterality Date  . BACK SURGERY  2004  . CESAREAN SECTION  1998  . ENDOMETRIAL ABLATION     Social History   Occupational History  . Not  on file  Tobacco Use  . Smoking status: Former 2005  . Smokeless tobacco: Never Used  Vaping Use  . Vaping Use: Never used  Substance and Sexual Activity  . Alcohol use: Yes    Alcohol/week: 6.0 standard drinks    Types: 6 Glasses of wine per week  . Drug use: Not Currently  . Sexual activity: Not on file

## 2021-03-06 ENCOUNTER — Telehealth: Payer: Self-pay

## 2021-03-06 NOTE — Telephone Encounter (Signed)
Patient was seen by [facility] for low back pain. She is reporting that she is not much better after medication. 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 Should she proceed with MRI? Please contact patient to discuss.

## 2021-03-10 NOTE — Telephone Encounter (Signed)
LMOM for patient of the below message and for her to call back and let us know which she would like to do and we will get that ordered for her

## 2021-03-11 ENCOUNTER — Telehealth: Payer: Self-pay | Admitting: Physical Medicine and Rehabilitation

## 2021-03-11 DIAGNOSIS — M5440 Lumbago with sciatica, unspecified side: Secondary | ICD-10-CM

## 2021-03-11 NOTE — Telephone Encounter (Signed)
Please see below.

## 2021-03-11 NOTE — Telephone Encounter (Signed)
Patient called needing to schedule an appointment with Dr Alvester Morin for her back. Patient was referred by Denny Peon. The number to contact patient is [phone]

## 2021-03-11 NOTE — Telephone Encounter (Signed)
I do not see a referral in the patient's chart. Please advise.

## 2021-03-12 NOTE — Telephone Encounter (Signed)
Called pt and inform her about the insurance.

## 2021-03-24 ENCOUNTER — Telehealth: Payer: Self-pay

## 2021-03-24 NOTE — Telephone Encounter (Signed)
FYI  I called pt to advise that her insurance will not cover her ESI referral and advised that she would have to complete 4 weeks of physical therapy first and then we could try to obtain authorization after that. Pt stat that she is feeling better and that the pain has resolved. Advised that if it should occur again to call the office and we can proceed with the referral. Pt verbalized understanding and will call with any questions.

## 2022-08-16 ENCOUNTER — Other Ambulatory Visit (HOSPITAL_BASED_OUTPATIENT_CLINIC_OR_DEPARTMENT_OTHER): Payer: Self-pay

## 2022-08-16 MED ORDER — INFLUENZA VAC SPLIT QUAD 0.5 ML IM SUSY
PREFILLED_SYRINGE | INTRAMUSCULAR | 0 refills | Status: AC
  Filled 2022-08-16: qty 0.5, 1d supply, fill #0
